# Patient Record
Sex: Female | Born: 1985 | Race: White | Hispanic: No | Smoking: Never smoker
Health system: Southern US, Community
[De-identification: ages and names within clinical notes are randomized; demographics above are authoritative.]

---

## 2010-12-09 ENCOUNTER — Ambulatory Visit (HOSPITAL_COMMUNITY)
Admission: AD | Admit: 2010-12-09 | Discharge: 2010-12-09 | Disposition: A | Discharge status: Home / Self Care | Payer: BC Managed Care – PPO | Source: Ambulatory Visit | Attending: Obstetrics & Gynecology | Admitting: Obstetrics & Gynecology

## 2010-12-09 ENCOUNTER — Inpatient Hospital Stay (HOSPITAL_COMMUNITY): Payer: BC Managed Care – PPO

## 2010-12-09 ENCOUNTER — Other Ambulatory Visit: Payer: Self-pay | Admitting: Family Medicine

## 2010-12-09 DIAGNOSIS — O071 Delayed or excessive hemorrhage following failed attempted termination of pregnancy: Secondary | ICD-10-CM

## 2010-12-09 DIAGNOSIS — R58 Hemorrhage, not elsewhere classified: Secondary | ICD-10-CM

## 2010-12-09 LAB — CBC
MCHC: 34.8 g/dL (ref 30.0–36.0)
Platelets: 155 10*3/uL (ref 150–400)
RDW: 13.7 % (ref 11.5–15.5)
WBC: 15.5 10*3/uL — ABNORMAL HIGH (ref 4.0–10.5)

## 2010-12-09 LAB — WET PREP, GENITAL
Clue Cells Wet Prep HPF POC: NONE SEEN
Trich, Wet Prep: NONE SEEN
Yeast Wet Prep HPF POC: NONE SEEN

## 2010-12-10 LAB — GC/CHLAMYDIA PROBE AMP, GENITAL
Chlamydia, DNA Probe: NEGATIVE
GC Probe Amp, Genital: NEGATIVE

## 2010-12-14 NOTE — Op Note (Signed)
NAME:  Molly Stewart, Molly Stewart               ACCOUNT NO.:  [id number]  MEDICAL RECORD NO.:  [id number]           PATIENT TYPE:  O  LOCATION:  [facility]                          [facility]  PHYSICIAN:  Elissa Grieshop S. Shawnie Pons, M.D.   DATE OF BIRTH:  07/16/1986  DATE OF PROCEDURE:  12/09/2010 DATE OF DISCHARGE:                              OPERATIVE REPORT   PREOPERATIVE DIAGNOSIS:  Hematometra.  POSTOPERATIVE DIAGNOSES:  Hematometra plus retained products of conception.  PROCEDURE:  Suction D&C.  SURGEON:  Shelbie Proctor. Shawnie Pons, MD  ANESTHESIA:  MAC and local, Dana Allan, MD  FINDINGS:  20-week size uterus, full of clots.  SPECIMENS:  Uterine contents to Pathology.  BLOOD LOSS:  500 mL.  COMPLICATIONS:  None known.  REASON FOR PROCEDURE:  Briefly, the patient is a 25 year old gravida 2, para 1-0-1-1 who underwent a elective termination today at [facility].  She was approximately 11 weeks long.  She fainted at the pharmacy after the procedure and came back in with vaginal bleeding via EMS.  Transvaginal sonography done in the MAU showed a very enlarged uterus with approximately 12 cm blood clot in the endometrium.  She was consented for procedure and taken to the OR.  Her hemoglobin was 10 at the start of procedure.  PROCEDURE:  The patient was taken to the OR.  She was placed in dorsal lithotomy in [place] stirrups.  She was prepped and draped in usual sterile fashion.  Red rubber catheter was used to drain her bladder.  A time-out was performed.  The patient received 100 mg of doxycycline IV prior to procedure.  Speculum was then placed inside the vagina, the cervix was visualized, grasped anteriorly with a single-tooth tenaculum and 0.25% Marcaine with epinephrine was injected for paracervical block. The uterus sounded to approximately 18 cm.  Sequential dilation was done until large enough to pass a 12-French curved suction curette. Initially with the trap on, several passes were made and  the suction would clog up as the trap could not drain quite as quickly as we were able to suck out the clot, so we took the trap off the suction canister and made straight suction.  Products of conception were seen passing through the cannula which probably explains why she was bleeding. Additionally, multiple passes were made until no more blood was removed from the uterus and the suction canister held approximately 450-500 mL of blood.  Sharp curettage was then done until a gritty texture was found in all 4 quadrants which was done all way up to the top of the fundus which was near the umbilicus.  The suction curette was passed again.  Very little blood clot or tissue was removed, so then all instruments were removed from the vagina.  Bimanual exam forced the uterus to come back down to a more reasonable size and was approximately 10 weeks size at the conclusion of the case.  The patient tolerated the procedure well.  All instrument, lap counts were correct x2.  The patient was awake, taken to recovery room in stable condition.     Shelbie Proctor. Shawnie Pons, M.D.  TSP/MEDQ  D:  12/09/2010  T:  12/10/2010  Job:  [id number]  Electronically Signed by Tinnie Gens M.D. on 12/14/2010 06:26:36 PM

## 2010-12-20 ENCOUNTER — Ambulatory Visit: Payer: BC Managed Care – PPO | Admitting: Family Medicine

## 2010-12-20 DIAGNOSIS — Z09 Encounter for follow-up examination after completed treatment for conditions other than malignant neoplasm: Secondary | ICD-10-CM

## 2011-01-03 NOTE — Progress Notes (Signed)
NAME:  Molly, Stewart NO.:  [id number]  MEDICAL RECORD NO.:  [id number]           PATIENT TYPE:  A  LOCATION:  [facility]                   FACILITY:  [facility]  PHYSICIAN:  Tinnie Gens, MD        DATE OF BIRTH:  Sep 28, 1986  DATE OF SERVICE:  12/20/2010                                 CLINIC NOTE  CHIEF COMPLAINT:  Postop followup.  HISTORY OF PRESENT ILLNESS:  The patient is a 25 year old G2, P1-0-1-1 who underwent elective termination and then showed up with acute hematometra postoperatively.  She was taken to the OR for a D and C by myself on December 09, 2010.  Otherwise, she is without significant complaint today.  Her bleeding is really light and has slacked off fairly dramatically.  She has restarted the [facility], which was given to her by a [facility].  She had a sample.  She needs a prescription of this.  She had been on Loestrin before and Depo-Provera, neither of which has really worked well for her, but the Marygrace Drought seems to be working without difficulty, and she would like a prescription to continue this. She has not had a Pap smear in approximately 2 years.  PAST SURGICAL HISTORY:  C-section  PAST MEDICAL HISTORY:  Negative.  SOCIAL HISTORY:  No tobacco, alcohol, or drug use.  MEDICATIONS:  She is on [place].  ALLERGIES:  None known.  PHYSICAL EXAMINATION:  VITAL SIGNS:  Her vitals are as noted in the chart. GENERAL:  She is a well-developed, well-nourished female in no acute stress. ABDOMEN:  Soft, nontender, nondistended. GENITOURINARY:  Normal external female genitalia.  BUS is normal. Vagina is pink and rugated.  Cervix is nulliparous without lesions. Uterus is now small, well involuted.  IMPRESSION: 1. Postoperative from a dilation and curettage after elective     termination. 2. Needs Pap smear.  PLAN:  I have advised her to come back for Pap smear to this office in the next 2-3 months.  Additionally, I have given a prescription  for Beyaz to continue as she is presently taking.          ______________________________ Tinnie Gens, MD    TP/MEDQ  D:  12/20/2010  T:  12/21/2010  Job:  [id number]

## 2012-04-03 ENCOUNTER — Emergency Department (HOSPITAL_COMMUNITY)
Admission: EM | Admit: 2012-04-03 | Discharge: 2012-04-03 | Disposition: A | Discharge status: Home / Self Care | Payer: BC Managed Care – PPO | Source: Home / Self Care | Attending: Emergency Medicine | Admitting: Emergency Medicine

## 2012-04-03 ENCOUNTER — Emergency Department (INDEPENDENT_AMBULATORY_CARE_PROVIDER_SITE_OTHER): Payer: BC Managed Care – PPO

## 2012-04-03 ENCOUNTER — Encounter (HOSPITAL_COMMUNITY): Payer: Self-pay

## 2012-04-03 DIAGNOSIS — K5900 Constipation, unspecified: Secondary | ICD-10-CM

## 2012-04-03 DIAGNOSIS — R109 Unspecified abdominal pain: Secondary | ICD-10-CM

## 2012-04-03 LAB — POCT URINALYSIS DIP (DEVICE)
Glucose, UA: NEGATIVE mg/dL
Ketones, ur: NEGATIVE mg/dL
Leukocytes, UA: NEGATIVE
Protein, ur: NEGATIVE mg/dL
Specific Gravity, Urine: 1.02 (ref 1.005–1.030)

## 2012-04-03 LAB — WET PREP, GENITAL
Clue Cells Wet Prep HPF POC: NONE SEEN
Yeast Wet Prep HPF POC: NONE SEEN

## 2012-04-03 LAB — POCT PREGNANCY, URINE: Preg Test, Ur: NEGATIVE

## 2012-04-03 MED ORDER — DOCUSATE SODIUM 100 MG PO CAPS: 100.0000 mg | ORAL_CAPSULE | Freq: Two times a day (BID) | ORAL | Status: AC

## 2012-04-03 MED ORDER — POLYETHYLENE GLYCOL 3350 17 GM/SCOOP PO POWD: 17.0000 g | Freq: Every day | ORAL | Status: AC

## 2012-04-03 NOTE — Discharge Instructions (Signed)
Drink extra fluids. It may take up to 3 days for the miralax to take effect. may also drink prune and apple juice. Return if you have a fever, if you start having severe abdominal pain, a fever >100.4, or any other concerns.   Go to www.goodrx.com to look up your medications. This will give you a list of where you can find your prescriptions at the most affordable prices.

## 2012-04-03 NOTE — ED Notes (Signed)
Pt has low abd pain since Monday, denies vaginal discharge or urinary symptoms.

## 2012-04-03 NOTE — ED Provider Notes (Signed)
History     CSN: [id number]  Arrival date & time 04/03/12  1337   First MD Initiated Contact with Patient 04/03/12 1439      Chief Complaint  Patient presents with  . Flank Pain    (Consider location/radiation/quality/duration/timing/severity/associated sxs/prior treatment) HPI Comments: Patient reports sharp, "pinching" diffuse abdominal pain for 5 days, states yesterday it feels like there is a "skewer" going from right periumbilical area down to the left lower quadrant. This is intermittent, last several minutes and then resolves. Is not related to eating, fasting, defecation, urination. It is a little worse when she sits down, and better when she stands up. Is otherwise not related to movement. No nausea, vomiting, fevers, abdominal distention. States she is having normal bowel movements, last bowel movement yesterday. No urinary urgency, frequency, cloudy or oderous urine, hematuria, back pain. No vaginal bleeding or discharge, dysparunia. She is sexually active with her husband, who is asymptomatic. STDs not a concern today. She has no history of abdominal surgeries. No history of GYN infections or STI's.  ROS as noted in HPI. All other ROS negative.   Patient is a 26 y.o. female presenting with flank pain. The history is provided by the patient. No language interpreter was used.  Flank Pain This is a new problem. The current episode started more than 2 days ago. The problem occurs daily. Pertinent negatives include no chest pain and no shortness of breath. Nothing aggravates the symptoms. Nothing relieves the symptoms. She has tried nothing for the symptoms. The treatment provided no relief.    History reviewed. No pertinent past medical history.  Past Surgical History  Procedure Date  . Cesarean section     History reviewed. No pertinent family history.  History  Substance Use Topics  . Smoking status: Never Smoker   . Smokeless tobacco: Not on file  . Alcohol Use: No     OB History    Grav Para Term Preterm Abortions TAB SAB Ect Mult Living                  Review of Systems  Respiratory: Negative for shortness of breath.   Cardiovascular: Negative for chest pain.  Genitourinary: Positive for flank pain.    Allergies  Review of patient's allergies indicates no known allergies.  Home Medications   Current Outpatient Rx  Name Route Sig Dispense Refill  . DOCUSATE SODIUM 100 MG PO CAPS Oral Take 1 capsule (100 mg total) by mouth every 12 (twelve) hours. 20 capsule 0  . POLYETHYLENE GLYCOL 3350 PO POWD Oral Take 17 g by mouth daily. 255 g 0    BP 116/82  Pulse 85  Temp 98.2 F (36.8 C) (Oral)  Resp 18  SpO2 100%  LMP 03/16/2012  Physical Exam  Nursing note and vitals reviewed. Constitutional: She is oriented to person, place, and time. She appears well-developed and well-nourished. No distress.  HENT:  Head: Normocephalic and atraumatic.  Eyes: EOM are normal.  Neck: Normal range of motion.  Cardiovascular: Normal rate, regular rhythm and normal heart sounds.   Pulmonary/Chest: Effort normal and breath sounds normal.  Abdominal: Soft. Normal appearance and bowel sounds are normal. She exhibits no distension. There is no tenderness. There is no rebound, no guarding and no CVA tenderness.  Genitourinary: Uterus normal. Pelvic exam was performed with patient supine. There is no rash on the right labia. There is no rash on the left labia. Uterus is not tender. Cervix exhibits no motion tenderness and  no friability. Right adnexum displays no mass, no tenderness and no fullness. Left adnexum displays no mass, no tenderness and no fullness. No erythema, tenderness or bleeding around the vagina. No foreign body around the vagina.       Thick white nonoderous vaginal d/c.Chaperone present during exam  Musculoskeletal: Normal range of motion.  Neurological: She is alert and oriented to person, place, and time.  Skin: Skin is warm and dry.   Psychiatric: She has a normal mood and affect. Her behavior is normal. Judgment and thought content normal.    ED Course  Procedures (including critical care time)   Labs Reviewed  POCT URINALYSIS DIP (DEVICE)  POCT PREGNANCY, URINE  WET PREP, GENITAL  GC/CHLAMYDIA PROBE AMP, GENITAL   Dg Abd 1 View  04/03/2012  *RADIOLOGY REPORT*  Clinical Data: Right-sided abdominal pain, constipation and nausea.  ABDOMEN - 1 VIEW  Comparison: None  Findings: A small to moderate amount of stool in the colon noted. No dilated bowel loops are present. No suspicious calcifications are noted. The bony structures are unremarkable.  IMPRESSION: Unremarkable exam.  Original Report Authenticated By: Rosendo Gros, M.D.     1. Constipation   2. Abdominal pain     Results for orders placed during the hospital encounter of 04/03/12  POCT URINALYSIS DIP (DEVICE)      Component Value Range   Glucose, UA NEGATIVE  NEGATIVE mg/dL   Bilirubin Urine NEGATIVE  NEGATIVE   Ketones, ur NEGATIVE  NEGATIVE mg/dL   Specific Gravity, Urine 1.020  1.005 - 1.030   Hgb urine dipstick NEGATIVE  NEGATIVE   pH 7.0  5.0 - 8.0   Protein, ur NEGATIVE  NEGATIVE mg/dL   Urobilinogen, UA 0.2  0.0 - 1.0 mg/dL   Nitrite NEGATIVE  NEGATIVE   Leukocytes, UA NEGATIVE  NEGATIVE  POCT PREGNANCY, URINE      Component Value Range   Preg Test, Ur NEGATIVE  NEGATIVE   Dg Abd 1 View  04/03/2012  *RADIOLOGY REPORT*  Clinical Data: Right-sided abdominal pain, constipation and nausea.  ABDOMEN - 1 VIEW  Comparison: None  Findings: A small to moderate amount of stool in the colon noted. No dilated bowel loops are present. No suspicious calcifications are noted. The bony structures are unremarkable.  IMPRESSION: Unremarkable exam.  Original Report Authenticated By: Rosendo Gros, M.D.    MDM  Pt abd exam is benign, no peritoneal signs. No evidence of surgical abd. Doubt SBO, mesenteric ischemia, appendicitis, hepatitis, cholecystitis,  pancreatitis, or perforated viscus. No evidence to support or suggest GYN pathology such as ovarian torsion or infection. Suspect vaginal discharge is physiologic, or asymptomatic yeast infection. Sent off GC, wet prep. UA negative for UTI, blood.  Imaging reviewed by myself. Report per radiologist.  Discussed labs, imaging, MDM with patient. Will refer to primary care resources for ongoing care. Gave patient strict return precautions. She agrees with plan.    Luiz Blare, MD 04/03/12 [phone]

## 2012-07-07 ENCOUNTER — Encounter (HOSPITAL_COMMUNITY): Payer: Self-pay | Admitting: Emergency Medicine

## 2012-07-07 DIAGNOSIS — S93499A Sprain of other ligament of unspecified ankle, initial encounter: Secondary | ICD-10-CM | POA: Insufficient documentation

## 2012-07-07 DIAGNOSIS — X500XXA Overexertion from strenuous movement or load, initial encounter: Secondary | ICD-10-CM | POA: Insufficient documentation

## 2012-07-07 NOTE — ED Notes (Signed)
C/o L ankle pain and swelling since jumping off a planter and "landing wrong" at 10pm tonight.  C/o pain and swelling to L ankle.

## 2012-07-08 ENCOUNTER — Emergency Department (HOSPITAL_COMMUNITY)
Admission: EM | Admit: 2012-07-08 | Discharge: 2012-07-08 | Disposition: A | Discharge status: Home / Self Care | Payer: BC Managed Care – PPO | Attending: Emergency Medicine | Admitting: Emergency Medicine

## 2012-07-08 ENCOUNTER — Emergency Department (HOSPITAL_COMMUNITY): Payer: BC Managed Care – PPO

## 2012-07-08 DIAGNOSIS — S93402A Sprain of unspecified ligament of left ankle, initial encounter: Secondary | ICD-10-CM

## 2012-07-08 LAB — POCT PREGNANCY, URINE: Preg Test, Ur: NEGATIVE

## 2012-07-08 MED ORDER — HYDROCODONE-ACETAMINOPHEN 5-325 MG PO TABS: 1.0000 | ORAL_TABLET | ORAL | Status: AC | PRN

## 2012-07-08 MED ORDER — HYDROCODONE-ACETAMINOPHEN 5-325 MG PO TABS
2.0000 | ORAL_TABLET | Freq: Once | ORAL | Status: AC
  Administered 2012-07-08: 2 via ORAL
  Filled 2012-07-08: qty 2

## 2012-07-08 NOTE — ED Notes (Signed)
[employer] notified.

## 2012-07-08 NOTE — Progress Notes (Signed)
Orthopedic Tech Progress Note Patient Details:  Molly Stewart 09/15/1986 [id number]  Ortho Devices Type of Ortho Device: Crutches;ASO   Haskell Flirt 07/08/2012, 2:01 AM

## 2012-07-08 NOTE — ED Provider Notes (Signed)
Medical screening examination/treatment/procedure(s) were performed by non-physician practitioner and as supervising physician I was immediately available for consultation/collaboration.  Donalda Job K Maelys Kinnick, MD 07/08/12 0643 

## 2012-07-08 NOTE — ED Notes (Signed)
Back to room via w/c, into b/r for urine sample.

## 2012-07-08 NOTE — ED Provider Notes (Signed)
History     CSN: [id number]  Arrival date & time 07/07/12  2342   First MD Initiated Contact with Patient 07/08/12 0122      Chief Complaint  Patient presents with  . Ankle Pain   HPI  History provided by the patient. Patient is a 26 year old female who states that she was coming off of cement Center box and twisted her left ankle. Patient reports inverting her ankle. This was followed by immediate pain and swelling. Patient did take some ibuprofen for symptoms. Injury occurred around 10 PM. She has also been trying to rest her ankle but has continued pain. Patient is concerned for possible fracture. Denies any numbness or weakness to the foot.   History reviewed. No pertinent past medical history.  Past Surgical History  Procedure Date  . Cesarean section     No family history on file.  History  Substance Use Topics  . Smoking status: Never Smoker   . Smokeless tobacco: Not on file  . Alcohol Use: No    OB History    Grav Para Term Preterm Abortions TAB SAB Ect Mult Living                  Review of Systems  Musculoskeletal:       Left ankle pain and swelling  Neurological: Negative for weakness and numbness.    Allergies  Review of patient's allergies indicates no known allergies.  Home Medications   Current Outpatient Rx  Name Route Sig Dispense Refill  . IBUPROFEN 800 MG PO TABS Oral Take 800 mg by mouth once. pain      BP 116/78  Pulse 109  Temp 99.1 F (37.3 C) (Oral)  Resp 18  SpO2 100%  LMP 07/01/2012  Physical Exam  Nursing note and vitals reviewed. Constitutional: She is oriented to person, place, and time. She appears well-developed and well-nourished. No distress.  HENT:  Head: Normocephalic.  Cardiovascular: Normal rate and regular rhythm.   No murmur heard. Pulmonary/Chest: Effort normal and breath sounds normal. No respiratory distress. She has no wheezes. She has no rales.  Musculoskeletal:       Localized swelling and tenderness  to the left lateral malleoli area. No gross deformities. No proximal fibular tenderness. No pain over the medial malleolus. No pain over proximal fifth metatarsal. Normal dorsal pedal pulses, movement in toes and sensation in foot and toes.  Neurological: She is alert and oriented to person, place, and time.  Skin: Skin is warm and dry. No erythema.  Psychiatric: She has a normal mood and affect. Her behavior is normal.    ED Course  Procedures   Results for orders placed during the hospital encounter of 07/08/12  POCT PREGNANCY, URINE      Component Value Range   Preg Test, Ur NEGATIVE  NEGATIVE      Dg Ankle Complete Left  07/08/2012  *RADIOLOGY REPORT*  Clinical Data: Fall.  Ankle pain.  Twisted ankle.  LEFT ANKLE COMPLETE - 3+ VIEW  Comparison: None.  Findings: Ankle mortise congruent.  Talar dome intact.  Anatomic alignment of the ankle.  There is no fracture.  Soft tissue hematoma is present overlying the lateral malleolus.  Ankle effusion is present on the lateral view.  IMPRESSION: No osseous injury.  Lateral malleolar soft tissue swelling.   Original Report Authenticated By: Andreas Newport, M.D.      1. Left ankle sprain       MDM  1:40AM patient seen and  evaluated. X-rays without signs of broken bones. This time suspect lateral ankle sprain. Patient given crutches and placed in aso. 2 Norco given in the emergency room. Will give prescription for the same for severe pain. Patient last used ibuprofen today with rice.        Angus Seller, [place] 07/08/12 [phone]

## 2012-07-08 NOTE — ED Notes (Signed)
Not in room

## 2012-07-08 NOTE — ED Notes (Signed)
To xray now via w/c.

## 2012-09-07 IMAGING — US US PELVIS COMPLETE
1 series · 13 of 25 positions shown · non-contrast
Comparison: None.

***ADDENDUM*** CREATED: 12/09/2010 [DATE]

Additional history is available.  The patient had an elective
abortion earlier today.  Study has been performed to rule out
retained products of conception.
Color Doppler imaging of the large endometrial mass demonstrates no
evidence of internal vascularity.  The mass is most likely a large
amount of thrombus.  Uterus surrounding the thrombus is contained
without evidence of uterine rupture.
The above findings most likely represent hematocolpos.  No evidence
of retained products of conception.
***END ADDENDUM*** SIGNED BY: Rimvidas Strolia, M.D.
CLINICAL DATA: Syncope with bleeding
TRANSABDOMINAL AND TRANSVAGINAL ULTRASOUND OF PELVIS
TECHNIQUE: Both transabdominal and transvaginal ultrasound
examinations of the pelvis were performed including evaluation of
the uterus, ovaries, adnexal regions, and pelvic cul-de-sac.
Transabdominal technique was performed for global imaging of the
pelvis.  Transvaginal technique was performed for detailed
evaluation of the endometrium and/or ovaries.

[Series 1: us pelvis complete · 52 acquisitions, 13 frames shown]
[im 1/52]
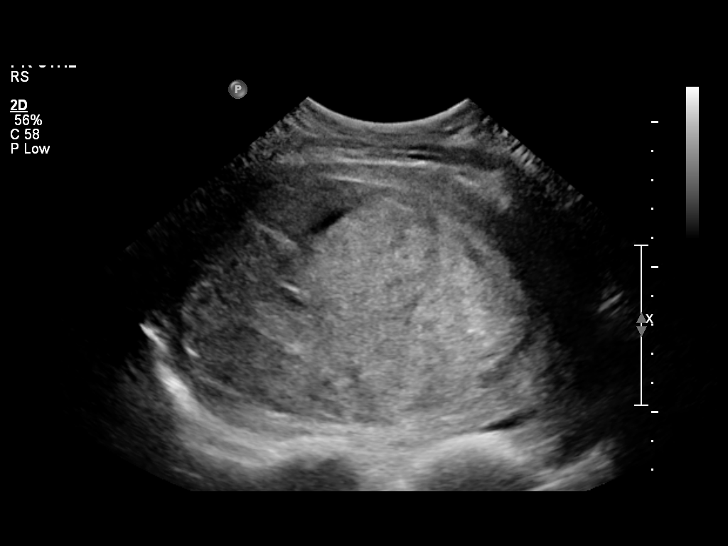
[im 5/52]
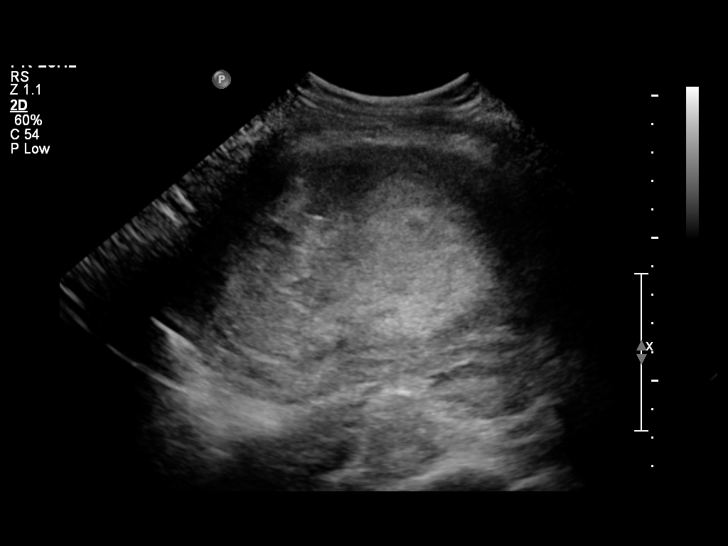
[im 9/52]
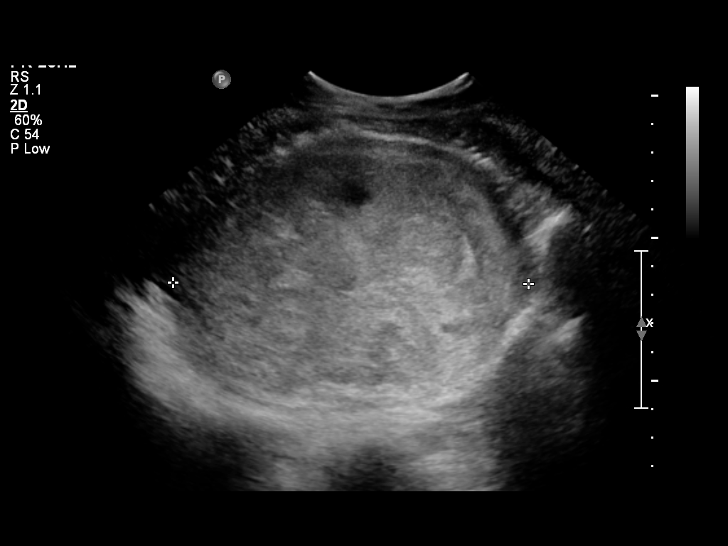
[im 13/52]
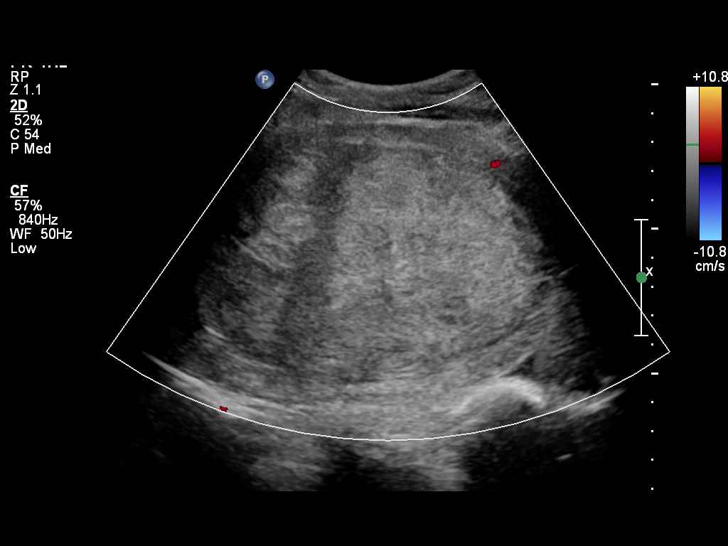
[im 18/52]
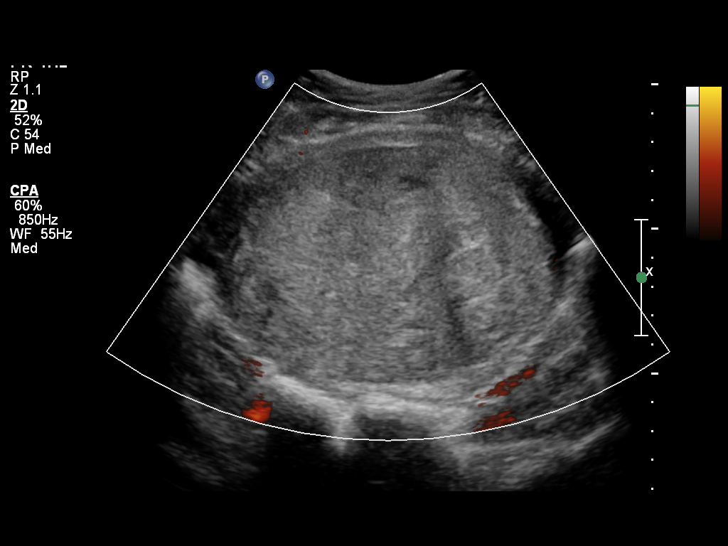
[im 22/52]
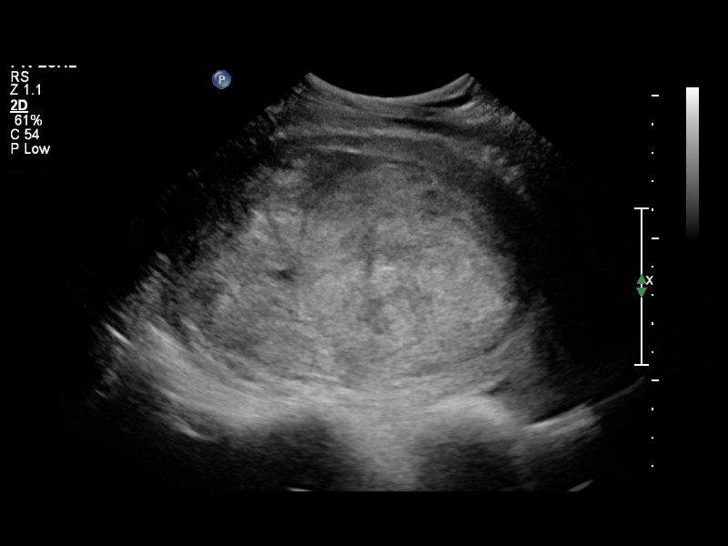
[im 26/52]
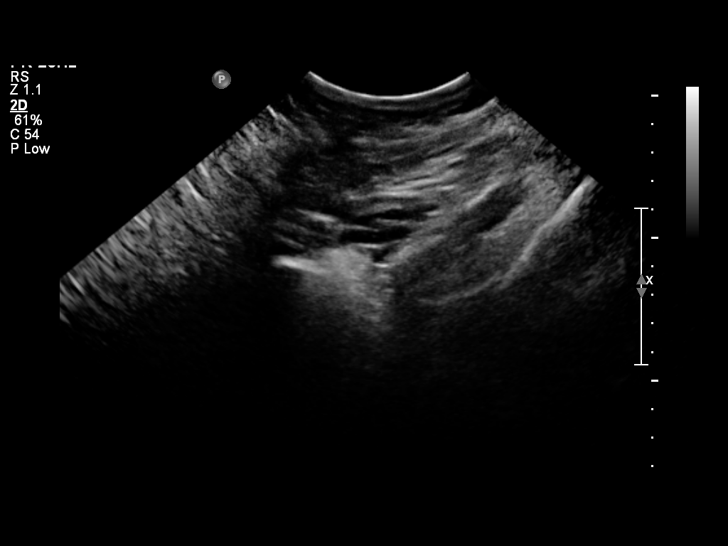
[im 30/52]
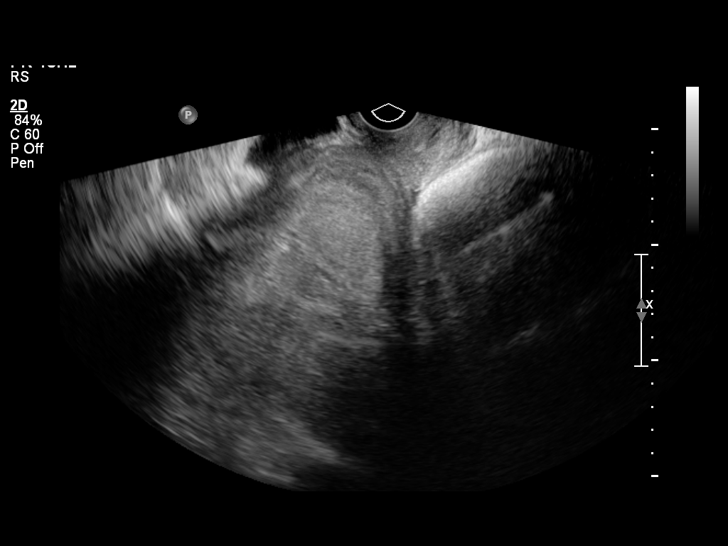
[im 35/52]
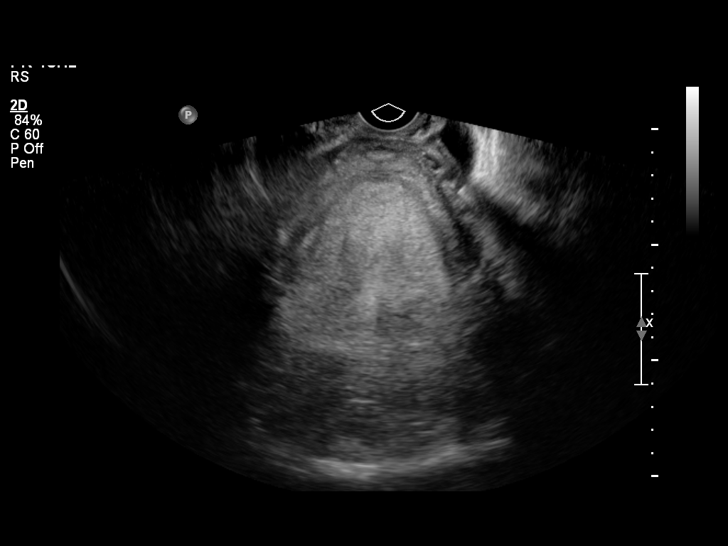
[im 39/52]
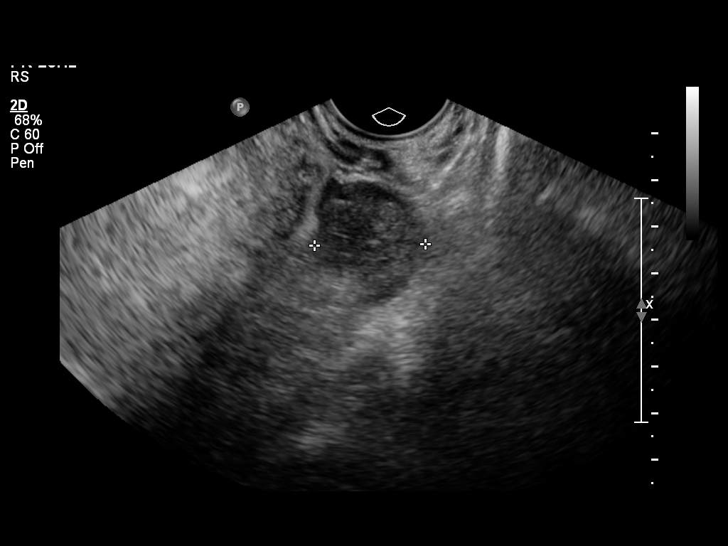
[im 43/52]
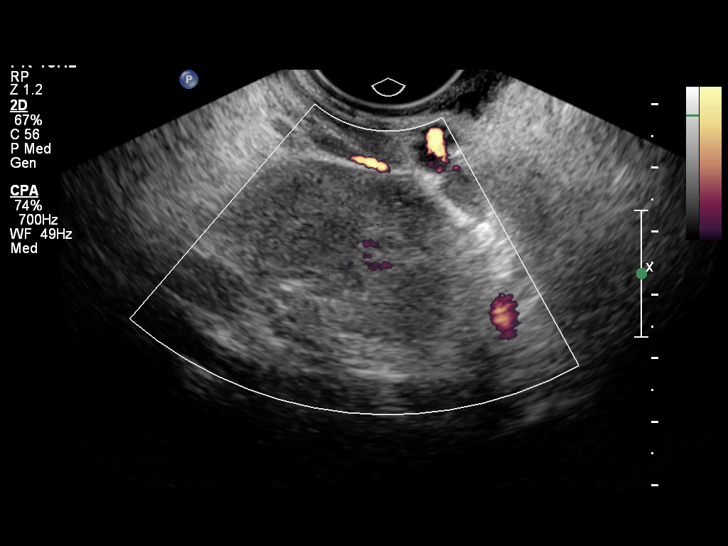
[im 47/52]
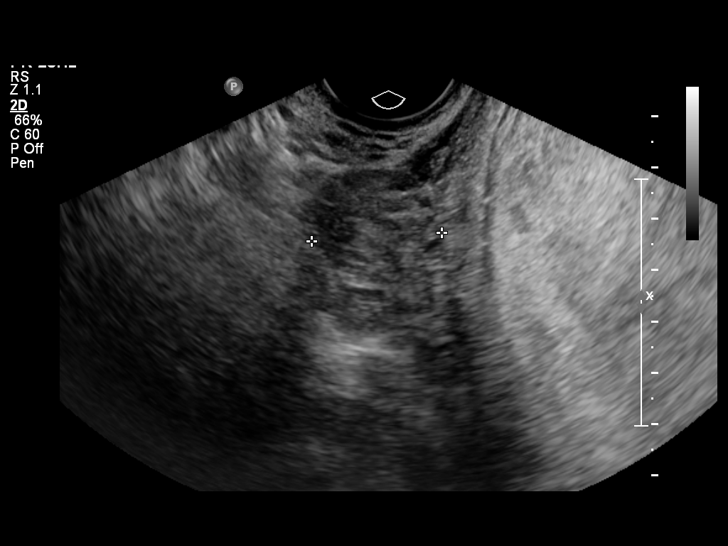
[im 52/52]
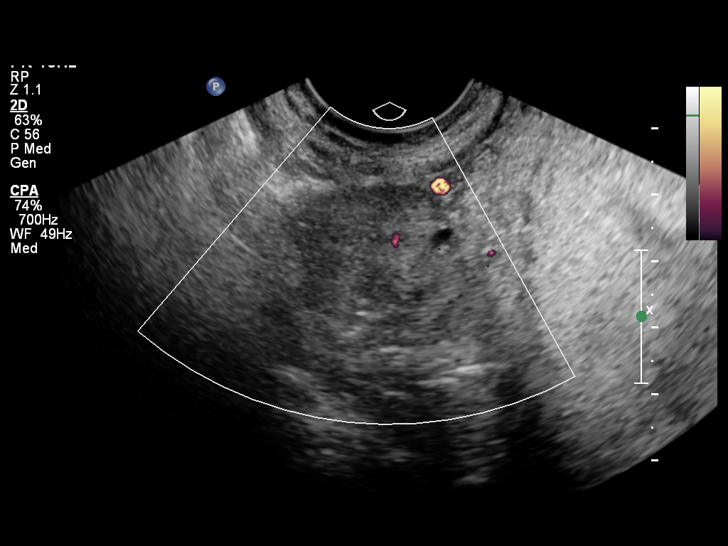

[13 of 25 positions shown; findings below may reference images not displayed]

FINDINGS: Uterus There is an normal is heterogeneous but predominately
hyperechoic mass within the central uterus, occupying the
endometrium.  Measures 12.1 x 7.9 x 10.6 cm.  The uterus is of
x 9.7 x 12.4 cm.

Right Ovary is 2.5 x 1.2 0.5 cm in within normal limits.

Left Ovary is 3.4 x 2.2 x 2.4 cm and is within normal limits.

Other Findings:  Small free fluid.
IMPRESSION: Large mass within the endometrial region.  Sono hysterogram or MRI
may be helpful.  Primary concern is endometrial pathology such as
polyp or carcinoma.

## 2015-04-03 ENCOUNTER — Emergency Department (HOSPITAL_COMMUNITY)
Admission: EM | Admit: 2015-04-03 | Discharge: 2015-04-03 | Disposition: A | Discharge status: Home / Self Care | Payer: Medicaid Other | Attending: Emergency Medicine | Admitting: Emergency Medicine

## 2015-04-03 ENCOUNTER — Encounter (HOSPITAL_COMMUNITY): Payer: Self-pay | Admitting: Emergency Medicine

## 2015-04-03 DIAGNOSIS — Z4800 Encounter for change or removal of nonsurgical wound dressing: Secondary | ICD-10-CM | POA: Diagnosis not present

## 2015-04-03 DIAGNOSIS — R21 Rash and other nonspecific skin eruption: Secondary | ICD-10-CM

## 2015-04-03 DIAGNOSIS — L03115 Cellulitis of right lower limb: Secondary | ICD-10-CM | POA: Insufficient documentation

## 2015-04-03 MED ORDER — DOXYCYCLINE HYCLATE 100 MG PO CAPS: 100.0000 mg | ORAL_CAPSULE | Freq: Two times a day (BID) | ORAL | Status: AC

## 2015-04-03 NOTE — ED Provider Notes (Signed)
CSN: [id number]     Arrival date & time 04/03/15  1142 History   This chart was scribed for non-physician practitioner Danelle Berry, PA-C working with Toy Cookey, MD by Murriel Hopper, ED Scribe. This patient was seen in room TR08C/TR08C and the patient's care was started at 2:03 PM.       Chief Complaint  Patient presents with  . Wound Check  . Rash      The history is provided by the patient. No language interpreter was used.     HPI Comments: Molly Stewart is a 29 y.o. female who presents to the Emergency Department complaining of a large worsening rash on the inner part of her right thigh that has been present since yesterday. Pt states that she came home from work and noticed it, and that it did not hurt yesterday, but that it does hurt today. Pt states that it is warm to the touch, is painful with ambulation or movement from her right knee to her right groin, and has redness and swelling that has worsened since its onset. She has been [place] her [place] the country", and did have a tick bite to the abdomen a few days ago, with a small red bump that is still present on however she did not feel anything bite her and denies any trauma to her leg. When she first noticed the lesion it is the same size it is now however has increased with redness and tenderness. No nausea, vomiting, fever, chills, chest pain, SOB.    History reviewed. No pertinent past medical history. Past Surgical History  Procedure Laterality Date  . Cesarean section     History reviewed. No pertinent family history. History  Substance Use Topics  . Smoking status: Never Smoker   . Smokeless tobacco: Not on file  . Alcohol Use: No   OB History    No data available     Review of Systems  Constitutional: Negative for fever and chills.  Respiratory: Negative for shortness of breath.   Cardiovascular: Negative for chest pain.  Gastrointestinal: Negative for nausea and vomiting.  Skin: Positive for  color change and rash.      Allergies  Review of patient's allergies indicates no known allergies.  Home Medications   Prior to Admission medications   Medication Sig Start Date End Date Taking? Authorizing Provider  doxycycline (VIBRAMYCIN) 100 MG capsule Take 1 capsule (100 mg total) by mouth 2 (two) times daily. 04/03/15 04/17/15  Danelle Berry, PA-C  HYDROcodone-acetaminophen (NORCO) 5-325 MG per tablet Take 1 tablet by mouth every 4 (four) hours as needed for pain. 07/08/12   Ivonne Andrew, PA-C  ibuprofen (ADVIL,MOTRIN) 800 MG tablet Take 800 mg by mouth once. pain    Historical Provider, MD   BP 113/74 mmHg  Pulse 83  Temp(Src) 98.3 F (36.8 C) (Oral)  Resp 16  Ht 5\' 6"  (1.676 m)  Wt 139 lb 9.6 oz (63.322 kg)  BMI 22.54 kg/m2  SpO2 100%   Physical Exam  Constitutional: She is oriented to person, place, and time. She appears well-developed and well-nourished. No distress.  HENT:  Head: Normocephalic and atraumatic.  Nose: Nose normal.  Eyes: Conjunctivae and EOM are normal. Pupils are equal, round, and reactive to light. Right eye exhibits no discharge. Left eye exhibits no discharge. No scleral icterus.  Cardiovascular: Normal rate.   Pulmonary/Chest: Effort normal.  Abdominal: She exhibits no distension.  Neurological: She is alert and oriented to person, place,  and time.  Skin: Skin is warm and dry. Rash noted. No ecchymosis and no laceration noted. She is not diaphoretic. No pallor.     8 cm x 4 cm lesion in a confluent,oblique distribution on her right inner thigh, deep red pigmentation that is nonblanching, tender to palpation, non-fluctuant.    Psychiatric: She has a normal mood and affect. Her behavior is normal. Judgment and thought content normal.  Nursing note and vitals reviewed.   ED Course  Procedures (including critical care time)  DIAGNOSTIC STUDIES: Oxygen Saturation is 100% on room air, normal by my interpretation.    COORDINATION OF CARE: 2:09 PM  Discussed treatment plan with pt at bedside and pt agreed to plan.    Labs Review Labs Reviewed - No data to display  Imaging Review No results found.   EKG Interpretation None      MDM   Final diagnoses:  Rash  Cellulitis of right lower extremity  Patient has a large lesion approximately 8 cm x 4 cm in an oblique distribution on her right inner thigh, deep red pigmentation that is nonblanching, has been out in the country recently, had a tick bite in a another area without any subsequent rash. Her symptoms of joint pain in her knee and hip, along with the appearance of the rash, and lymphadenopathy, we'll treat the patient with doxycycline to cover cellulitis and Lyme, although Lyme is not endemic to this area, patient agrees to be reevaluated in a week, either here or at an urgent care, or to return with any increasing redness or with fever chills nausea and vomiting  Dr. Micheline Maze was willing to see the patient to also evaluate the rash, however patient requested to leave without the doctor also seeing her. She was prescribed doxycycline, acknowledges strick return precautions, patient was not febrile or tachycardic, only complaining of pain in her legs from her knee to her hip, feel she is stable for discharge.  Filed Vitals:   04/03/15 1207 04/03/15 1412  BP: 129/82 113/74  Pulse: 83 83  Temp: 98.3 F (36.8 C) 98.3 F (36.8 C)  TempSrc: Oral Oral  Resp: 14 16  Height:  (1.676 m)   Weight: 139 lb 9.6 oz (63.322 kg)   SpO2: 100% 100%   I personally performed the services described in this documentation, which was scribed in my presence. The recorded information has been reviewed and is accurate.    Danelle Berry, PA-C 04/03/15 1803  Toy Cookey, MD 04/04/15 [phone]

## 2015-04-03 NOTE — ED Notes (Signed)
Pt here for redness and wound possible rash with pain to right upper leg

## 2015-04-03 NOTE — ED Notes (Signed)
Declined W/C at D/C and was escorted to lobby by RN. 

## 2015-04-04 ENCOUNTER — Emergency Department (HOSPITAL_COMMUNITY)
Admission: EM | Admit: 2015-04-04 | Discharge: 2015-04-04 | Disposition: A | Discharge status: Home / Self Care | Payer: Medicaid Other | Attending: Emergency Medicine | Admitting: Emergency Medicine

## 2015-04-04 ENCOUNTER — Encounter (HOSPITAL_COMMUNITY): Payer: Self-pay | Admitting: *Deleted

## 2015-04-04 DIAGNOSIS — M79604 Pain in right leg: Secondary | ICD-10-CM

## 2015-04-04 DIAGNOSIS — Y929 Unspecified place or not applicable: Secondary | ICD-10-CM | POA: Diagnosis not present

## 2015-04-04 DIAGNOSIS — L03115 Cellulitis of right lower limb: Secondary | ICD-10-CM | POA: Insufficient documentation

## 2015-04-04 DIAGNOSIS — S70361A Insect bite (nonvenomous), right thigh, initial encounter: Secondary | ICD-10-CM | POA: Diagnosis not present

## 2015-04-04 DIAGNOSIS — Y939 Activity, unspecified: Secondary | ICD-10-CM | POA: Insufficient documentation

## 2015-04-04 DIAGNOSIS — S79921A Unspecified injury of right thigh, initial encounter: Secondary | ICD-10-CM | POA: Diagnosis present

## 2015-04-04 DIAGNOSIS — W57XXXA Bitten or stung by nonvenomous insect and other nonvenomous arthropods, initial encounter: Secondary | ICD-10-CM | POA: Diagnosis not present

## 2015-04-04 DIAGNOSIS — Y999 Unspecified external cause status: Secondary | ICD-10-CM | POA: Insufficient documentation

## 2015-04-04 NOTE — ED Provider Notes (Signed)
CSN: [id number]     Arrival date & time 04/04/15  1838 History   First MD Initiated Contact with Patient 04/04/15 1947     Chief Complaint  Patient presents with  . Rash     (Consider location/radiation/quality/duration/timing/severity/associated sxs/prior Treatment) HPI Comments: Molly Stewart is a 29 y.o. healthy female who presents to the ED with complaints of worsening erythema, warmth, and swelling to a possible insect bite to her right thigh onset 2 days ago. She states that she believes she had an insect bite to her right thigh, was seen yesterday and prescribed doxycycline, and today the erythema and warmth has spread just outside of the line drawn to the area yesterday. She reports that the pain is 5/10 intermittent with movement or touching the area, aching, radiating to her hip and knee, improved with ibuprofen. She has been taking doxycycline as she was prescribed. She states that she had a tick bite 2-3 weeks ago but she immediately removed it before it was embedded. She denies any fevers, chills, loss of range of motion of the knee or hip, joint swelling, drainage from the area, red streaking, chest pain, shortness breath, abdominal pain, nausea, vomiting, diarrhea, constipation, dysuria, hematuria, vaginal bleeding or discharge, numbness, tingling, or weakness. No medical issues or immunosuppressant meds. No PCP.   Patient is a 29 y.o. female presenting with rash. The history is provided by the patient. No language interpreter was used.  Rash Location:  Leg Leg rash location:  R leg Quality: painful and redness   Pain details:    Quality:  Aching   Severity:  Mild   Onset quality:  Gradual   Duration:  2 days   Timing:  Intermittent   Progression:  Unchanged Severity:  Mild Onset quality:  Gradual Duration:  2 days Timing:  Constant Progression:  Worsening Chronicity:  New Context: insect bite/sting   Relieved by:  OTC analgesics Worsened by:  Nothing  tried Ineffective treatments:  None tried Associated symptoms: joint pain (R hip/knee radiating from thigh) and myalgias (R thigh)   Associated symptoms: no abdominal pain, no diarrhea, no fever, no nausea, no shortness of breath and not vomiting     History reviewed. No pertinent past medical history. Past Surgical History  Procedure Laterality Date  . Cesarean section     No family history on file. History  Substance Use Topics  . Smoking status: Never Smoker   . Smokeless tobacco: Not on file  . Alcohol Use: No   OB History    No data available     Review of Systems  Constitutional: Negative for fever and chills.  Respiratory: Negative for shortness of breath.   Cardiovascular: Negative for chest pain.  Gastrointestinal: Negative for nausea, vomiting, abdominal pain, diarrhea and constipation.  Genitourinary: Negative for dysuria, hematuria, vaginal bleeding and vaginal discharge.  Musculoskeletal: Positive for myalgias (R thigh) and arthralgias (R hip/knee radiating from thigh). Negative for joint swelling.  Skin: Positive for color change (R thigh) and rash.  Allergic/Immunologic: Negative for immunocompromised state.  Neurological: Negative for weakness and numbness.  Psychiatric/Behavioral: Negative for confusion.   10 Systems reviewed and are negative for acute change except as noted in the HPI.    Allergies  Review of patient's allergies indicates no known allergies.  Home Medications   Prior to Admission medications   Medication Sig Start Date End Date Taking? Authorizing Provider  doxycycline (VIBRAMYCIN) 100 MG capsule Take 1 capsule (100 mg total) by mouth 2 (two)  times daily. 04/03/15 04/17/15  Danelle Berry, PA-C  HYDROcodone-acetaminophen (NORCO) 5-325 MG per tablet Take 1 tablet by mouth every 4 (four) hours as needed for pain. 07/08/12   Ivonne Andrew, PA-C  ibuprofen (ADVIL,MOTRIN) 800 MG tablet Take 800 mg by mouth once. pain    Historical Provider, MD    BP 102/71 mmHg  Pulse 97  Temp(Src) 98.9 F (37.2 C) (Oral)  Resp 18  Ht  (1.676 m)  Wt 139 lb (63.05 kg)  BMI 22.45 kg/m2  SpO2 100% Physical Exam  Constitutional: She is oriented to person, place, and time. Vital signs are normal. She appears well-developed and well-nourished.  Non-toxic appearance. No distress.  Afebrile, nontoxic, NAD  HENT:  Head: Normocephalic and atraumatic.  Mouth/Throat: Oropharynx is clear and moist and mucous membranes are normal.  Eyes: Conjunctivae and EOM are normal. Right eye exhibits no discharge. Left eye exhibits no discharge.  Neck: Normal range of motion. Neck supple.  Cardiovascular: Normal rate, regular rhythm, normal heart sounds and intact distal pulses.  Exam reveals no gallop and no friction rub.   No murmur heard. Pulmonary/Chest: Effort normal and breath sounds normal. No respiratory distress. She has no decreased breath sounds. She has no wheezes. She has no rhonchi. She has no rales.  Abdominal: Soft. Normal appearance and bowel sounds are normal. She exhibits no distension. There is no tenderness. There is no rigidity, no rebound, no guarding, no CVA tenderness, no tenderness at McBurney's point and negative Murphy's sign.  Musculoskeletal: Normal range of motion.  FROM intact at R hip and knee, both nonTTP without effusion or erythema, no warmth over joints. R inner thigh with ~8.5x4.5 cm area of erythema and warmth with mild induration without fluctuance, somewhat target-like, nonblanching, mildly TTP. SEE PICTURE BELOW. MAE x4 Strength and sensation grossly intact Distal pulses intact Gait steady  Neurological: She is alert and oriented to person, place, and time. She has normal strength. No sensory deficit.  Skin: Skin is warm, dry and intact. No rash noted. There is erythema.  R thigh erythema/warmth as noted above. SEE PICTURE BELOW  Psychiatric: She has a normal mood and affect.  Nursing note and vitals  reviewed.     ED Course  Procedures (including critical care time) Labs Review Labs Reviewed - No data to display  Imaging Review No results found.   EKG Interpretation None      MDM   Final diagnoses:  Cellulitis of leg, right  Tick bite  Right leg pain    29 y.o. female here with R thigh cellulitis which has not improved after one day of PO doxycycline, barely spread outside of the line drawn yesterday. Mildly tender, mildly warm to touch, erythematous approx 1cm outside the line without fluctuance, barely indurated skin. Possible tick bite 2-3wks ago, concerning for possible lyme disease particularly since lesion appeared more target-like yesterday. Doxycycline would be the correct drug to treat, and this is likely not failure of tx but rather too early to tell. Doubt need for labs or imaging. Will have her continue doxy and recheck in 2 days unless pt develops even further spreading or systemic symptoms. Site outlined again. Discussed heat compress and tylenol/motrin for pain. Will have her rechecked in 2 days. I explained the diagnosis and have given explicit precautions to return to the ER including for any other new or worsening symptoms. The patient understands and accepts the medical plan as it's been dictated and I have answered their questions. Discharge instructions  concerning home care and prescriptions have been given. The patient is STABLE and is discharged to home in good condition.  BP 109/80 mmHg  Pulse 104  Temp(Src) 98.9 F (37.2 C) (Oral)  Resp 18  Ht  (1.676 m)  Wt 139 lb (63.05 kg)  BMI 22.45 kg/m2  SpO2 99%   Molly Schey Camprubi-Soms, PA-C 04/04/15 2029  Vanetta Mulders, MD 04/08/15 1408

## 2015-04-04 NOTE — Discharge Instructions (Signed)
Continue taking the antibiotic as directed. Continue using tylenol or ibuprofen as needed for pain. Apply warm compresses to affected area throughout the day. Followup with this department in 2 days for wound recheck. Monitor area for signs of infection to include, but not limited to: fevers, increasing pain, spreading redness, streaking redness, drainage/pus, or worsening swelling. Return to emergency department for emergent changing or worsening symptoms.    Cellulitis Cellulitis is an infection of the skin and the tissue under the skin. The infected area is usually red and tender. This happens most often in the arms and lower legs. HOME CARE   Take your antibiotic medicine as told. Finish the medicine even if you start to feel better.  Keep the infected arm or leg raised (elevated).  Put a warm cloth on the area up to 4 times per day.  Only take medicines as told by your doctor.  Keep all doctor visits as told. GET HELP IF: 1. You see red streaks on the skin coming from the infected area. 2. Your red area gets bigger or turns a dark color. 3. Your bone or joint under the infected area is painful after the skin heals. 4. Your infection comes back in the same area or different area. 5. You have a puffy (swollen) bump in the infected area. 6. You have new symptoms. 7. You have a fever. GET HELP RIGHT AWAY IF:   You feel very sleepy.  You throw up (vomit) or have watery poop (diarrhea).  You feel sick and have muscle aches and pains. MAKE SURE YOU:   Understand these instructions.  Will watch your condition.  Will get help right away if you are not doing well or get worse. Document Released: 03/17/2008 Document Revised: 02/13/2014 Document Reviewed: 12/15/2011 [facility] Patient Information 2015 [place], [place]. This information is not intended to replace advice given to you by your health care provider. Make sure you discuss any questions you have with your health care  provider.  Lyme Disease You may have been bitten by a tick and are to watch for the development of Lyme Disease. Lyme Disease is an infection that is caused by a bacteria The bacteria causing this disease is named Borreilia burgdorferi. If a tick is infected with this bacteria and then bites you, then Lyme Disease may occur. These ticks are carried by deer and rodents such as rabbits and mice and infest grassy as well as forested areas. Fortunately most tick bites do not cause Lyme Disease.  Lyme Disease is easier to prevent than to treat. First, covering your legs with clothing when walking in areas where ticks are possibly abundant will prevent their attachment because ticks tend to stay within inches of the ground. Second, using insecticides containing DEET can be applied on skin or clothing. Last, because it takes about 12 to 24 hours for the tick to transmit the disease after attachment to the human host, you should inspect your body for ticks twice a day when you are in areas where Lyme Disease is common. You must look thoroughly when searching for ticks. The Ixodes tick that carries Lyme Disease is very small. It is around the size of a sesame seed (picture of tick is not actual size). Removal is best done by grasping the tick by the head and pulling it out. Do not to squeeze the body of the tick. This could inject the infecting bacteria into the bite site. Wash the area of the bite with an antiseptic solution after  removal.  Lyme Disease is a disease that may affect many body systems. Because of the small size of the biting tick, most people do not notice being bitten. The first sign of an infection is usually a round red rash that extends out from the center of the tick bite. The center of the lesion may be blood colored (hemorrhagic) or have tiny blisters (vesicular). Most lesions have bright red outer borders and partial central clearing. This rash may extend out many inches in diameter, and multiple  lesions may be present. Other symptoms such as fatigue, headaches, chills and fever, general achiness and swelling of lymph glands may also occur. If this first stage of the disease is left untreated, these symptoms may gradually resolve by themselves, or progressive symptoms may occur because of spread of infection to other areas of the body.  Follow up with your caregiver to have testing and treatment if you have a tick bite and you develop any of the above complaints. Your caregiver may recommend preventative (prophylactic) medications which kill bacteria (antibiotics). Once a diagnosis of Lyme Disease is made, antibiotic treatment is highly likely to cure the disease. Effective treatment of late stage Lyme Disease may require longer courses of antibiotic therapy.  MAKE SURE YOU:   Understand these instructions.  Will watch your condition.  Will get help right away if you are not doing well or get worse. Document Released: 01/05/2001 Document Revised: 12/22/2011 Document Reviewed: 03/09/2009 [facility] Patient Information 2015 [place], [place]. This information is not intended to replace advice given to you by your health care provider. Make sure you discuss any questions you have with your health care provider.  Tick Bite Information Ticks are insects that attach themselves to the skin. There are many types of ticks. Common types include wood ticks and deer ticks. Sometimes, ticks carry diseases that can make a person very ill. The most common places for ticks to attach themselves are the scalp, neck, armpits, waist, and groin.  HOW CAN YOU PREVENT TICK BITES? Take these steps to help prevent tick bites when you are outdoors:  Wear long sleeves and long pants.  Wear white clothes so you can see ticks more easily.  Tuck your pant legs into your socks.  If walking on a trail, stay in the middle of the trail to avoid brushing against bushes.  Avoid walking through areas with long grass.  Put  bug spray on all skin that is showing and along boot tops, pant legs, and sleeve cuffs.  Check clothes, hair, and skin often and before going inside.  Brush off any ticks that are not attached.  Take a shower or bath as soon as possible after being outdoors. HOW SHOULD YOU REMOVE A TICK? Ticks should be removed as soon as possible to help prevent diseases. 8. If latex gloves are available, put them on before trying to remove a tick. 9. Use tweezers to grasp the tick as close to the skin as possible. You may also use curved forceps or a tick removal tool. Grasp the tick as close to its head as possible. Avoid grasping the tick on its body. 10. Pull gently upward until the tick lets go. Do not twist the tick or jerk it suddenly. This may break off the tick's head or mouth parts. 11. Do not squeeze or crush the tick's body. This could force disease-carrying fluids from the tick into your body. 12. After the tick is removed, wash the bite area and your hands  with soap and water or alcohol. 13. Apply a small amount of antiseptic cream or ointment to the bite site. 14. Wash any tools that were used. Do not try to remove a tick by applying a hot match, petroleum jelly, or fingernail polish to the tick. These methods do not work. They may also increase the chances of disease being spread from the tick bite. WHEN SHOULD YOU SEEK HELP? Contact your health care provider if you are unable to remove a tick or if a part of the tick breaks off in the skin. After a tick bite, you need to watch for signs and symptoms of diseases that can be spread by ticks. Contact your health care provider if you develop any of the following:  Fever.  Rash.  Redness and puffiness (swelling) in the area of the tick bite.  Tender, puffy lymph glands.  Watery poop (diarrhea).  Weight loss.  Cough.  Feeling more tired than normal (fatigue).  Muscle, joint, or bone pain.  Belly (abdominal)  pain.  Headache.  Change in your level of consciousness.  Trouble walking or moving your legs.  Loss of feeling (numbness) in the legs.  Loss of movement (paralysis).  Shortness of breath.  Confusion.  Throwing up (vomiting) many times. Document Released: 12/24/2009 Document Revised: 06/01/2013 Document Reviewed: 03/09/2013 [facility] Patient Information 2015 [place], [place]. This information is not intended to replace advice given to you by your health care provider. Make sure you discuss any questions you have with your health care provider.

## 2015-04-04 NOTE — ED Notes (Signed)
Pt in for re eval of R inner thigh rash, estimated 10 cm x 5 cm area of redness noted, seen yesterday for the same with the area marked with a skin marker, pt reports increase in redness, pt reports taking Doxycycline today as prescribed, no drainage noted, A&O x4, pt ambulatory

## 2018-05-12 ENCOUNTER — Emergency Department (HOSPITAL_COMMUNITY)
Admission: EM | Admit: 2018-05-12 | Discharge: 2018-05-12 | Disposition: A | Discharge status: Home / Self Care | Payer: Medicaid Other | Attending: Emergency Medicine | Admitting: Emergency Medicine

## 2018-05-12 ENCOUNTER — Emergency Department (HOSPITAL_COMMUNITY): Payer: Medicaid Other

## 2018-05-12 ENCOUNTER — Other Ambulatory Visit: Payer: Self-pay

## 2018-05-12 ENCOUNTER — Encounter (HOSPITAL_COMMUNITY): Payer: Self-pay | Admitting: Emergency Medicine

## 2018-05-12 DIAGNOSIS — M25562 Pain in left knee: Secondary | ICD-10-CM | POA: Insufficient documentation

## 2018-05-12 DIAGNOSIS — Z79899 Other long term (current) drug therapy: Secondary | ICD-10-CM | POA: Diagnosis not present

## 2018-05-12 DIAGNOSIS — M7918 Myalgia, other site: Secondary | ICD-10-CM

## 2018-05-12 MED ORDER — IBUPROFEN 600 MG PO TABS: 600.0000 mg | ORAL_TABLET | Freq: Four times a day (QID) | ORAL | 0 refills | Status: AC | PRN

## 2018-05-12 MED ORDER — IBUPROFEN 400 MG PO TABS
600.0000 mg | ORAL_TABLET | Freq: Once | ORAL | Status: AC
  Administered 2018-05-12: 600 mg via ORAL
  Filled 2018-05-12: qty 1

## 2018-05-12 MED ORDER — METHOCARBAMOL 500 MG PO TABS: 500.0000 mg | ORAL_TABLET | Freq: Two times a day (BID) | ORAL | 0 refills | Status: AC
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
## 2018-05-12 NOTE — Discharge Instructions (Signed)
The pain your experiencing is likely due to muscle strain, you may take Ibuprofen and Robaxin as needed for pain management. Do not combine with any pain reliever other than tylenol. The muscle soreness should improve over the next week. Follow up with your family doctor in the next week for a recheck if you are still having symptoms. Return to ED if pain is worsening, you develop weakness or numbness of extremities, or new or concerning symptoms develop. °
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
